# Patient Record
Sex: Female | Born: 1995 | Race: Black or African American | Hispanic: No | Marital: Single | Smoking: Never smoker
Health system: Southern US, Community
[De-identification: ages and names within clinical notes are randomized; demographics above are authoritative.]

## PROBLEM LIST (undated history)

## (undated) DIAGNOSIS — Z8781 Personal history of (healed) traumatic fracture: Secondary | ICD-10-CM

## (undated) HISTORY — DX: Personal history of (healed) traumatic fracture: Z87.81

---
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
## 2014-04-29 ENCOUNTER — Encounter (HOSPITAL_BASED_OUTPATIENT_CLINIC_OR_DEPARTMENT_OTHER): Payer: Self-pay | Admitting: *Deleted

## 2014-05-23 IMAGING — CR DG CHEST 2V
2 series · 2 of 2 positions shown · non-contrast
Comparison: 10/19/2011

CLINICAL DATA: Chest pain

CHEST - 2 VIEW

[w chest pa]
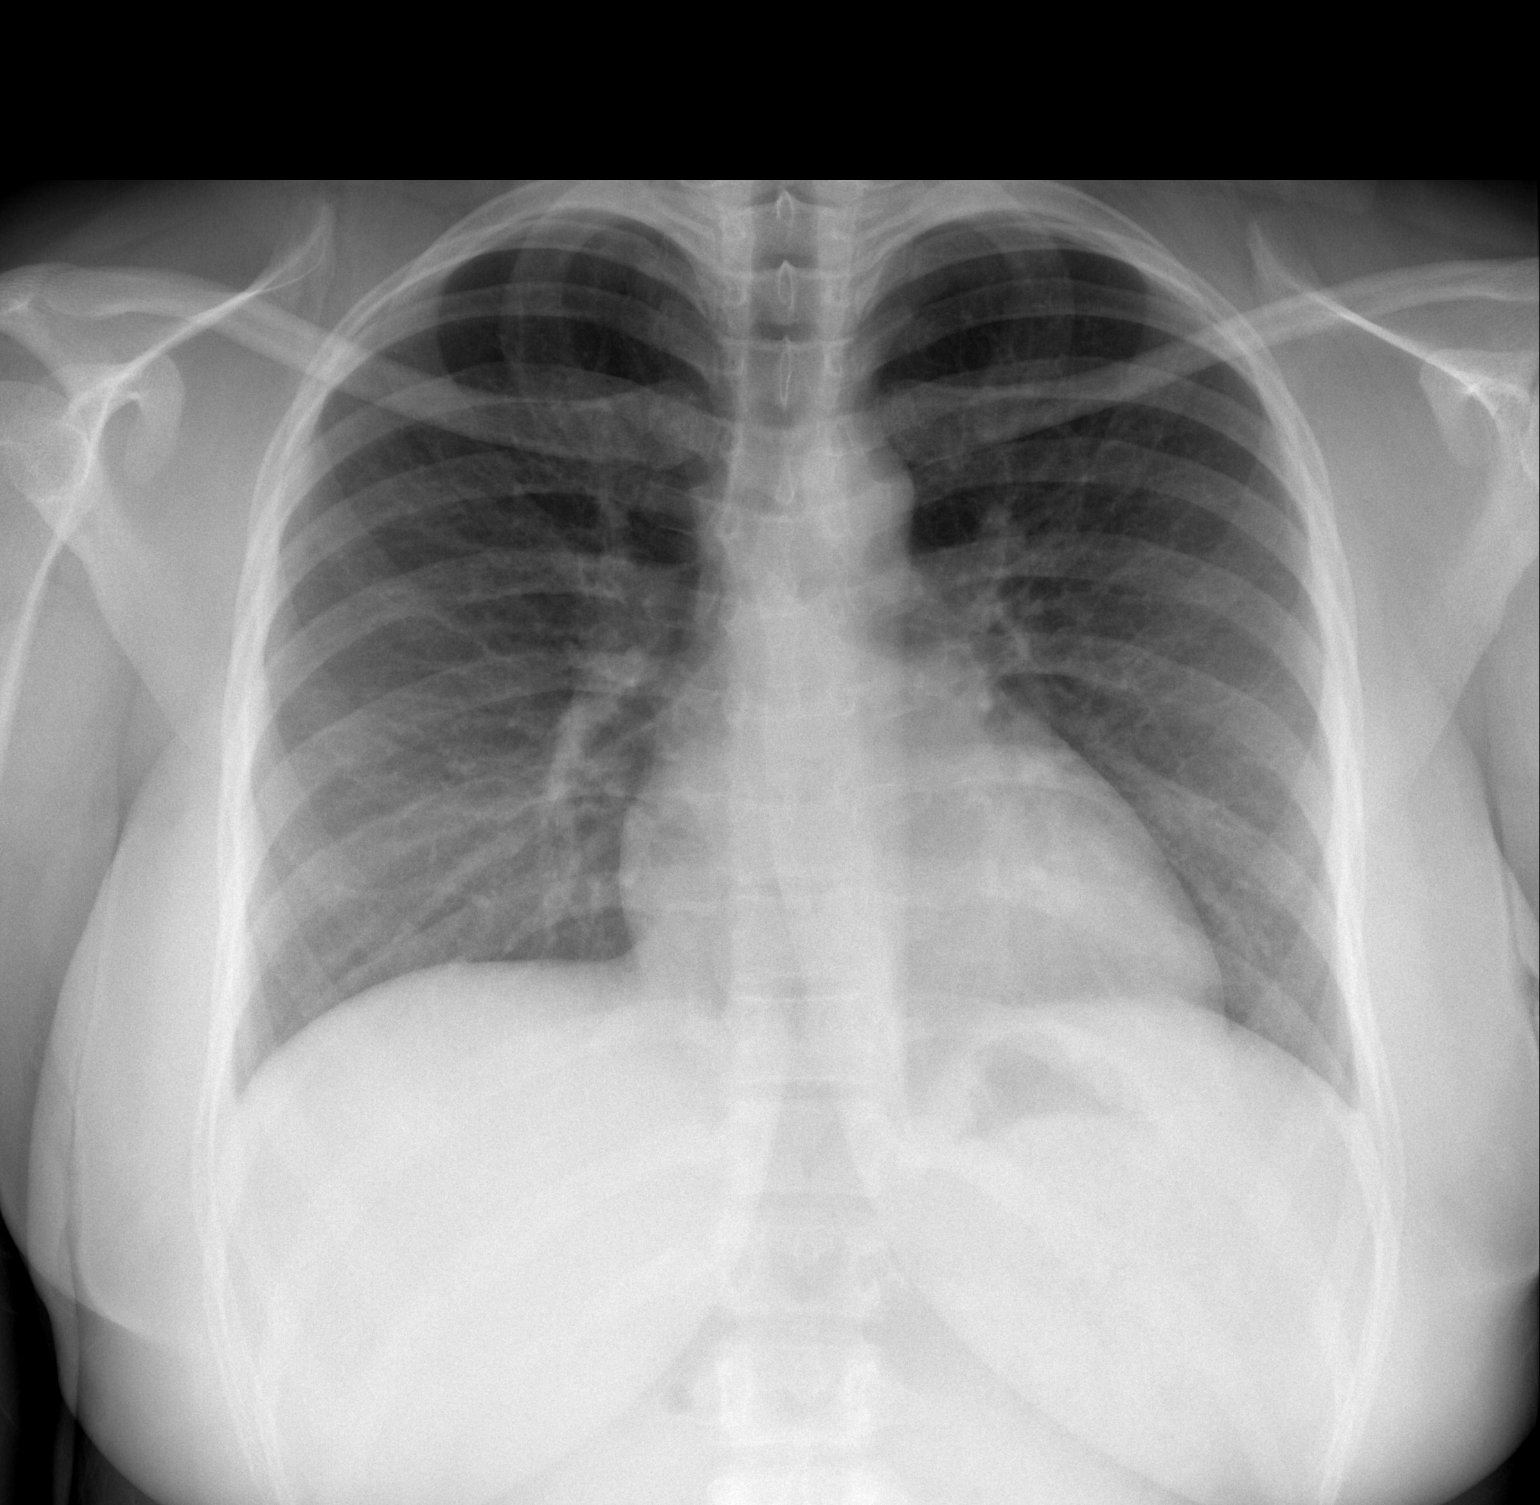

[w chest lat]
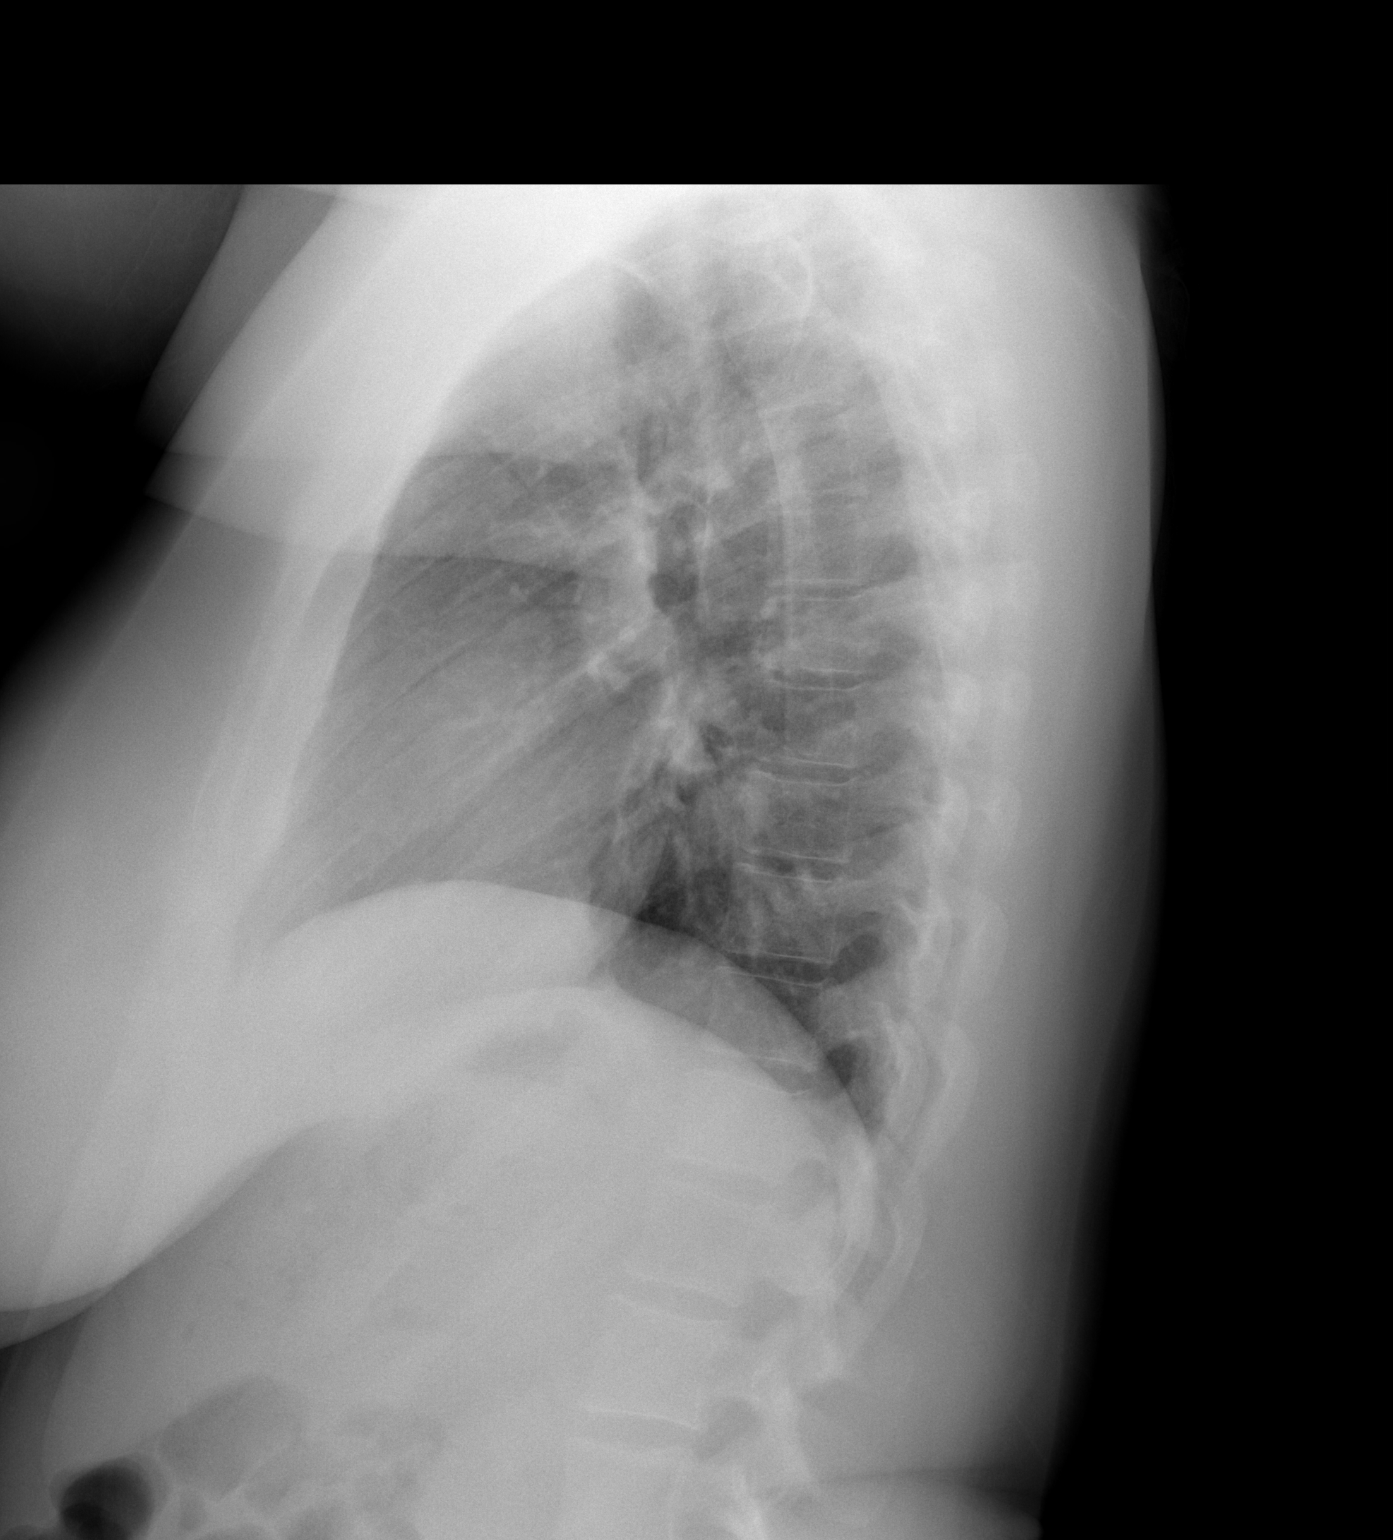

[2 of 2 positions shown; findings below may reference images not displayed]

FINDINGS: The heart and pulmonary vascularity are within normal
limits.  The lungs are clear bilaterally.  No acute bony
abnormality is identified.
IMPRESSION: No acute abnormality noted.

## 2015-01-01 ENCOUNTER — Encounter (HOSPITAL_BASED_OUTPATIENT_CLINIC_OR_DEPARTMENT_OTHER): Payer: Self-pay

## 2015-01-01 ENCOUNTER — Emergency Department (HOSPITAL_BASED_OUTPATIENT_CLINIC_OR_DEPARTMENT_OTHER)
Admission: EM | Admit: 2015-01-01 | Discharge: 2015-01-01 | Disposition: A | Discharge status: Home / Self Care | Payer: Managed Care, Other (non HMO) | Attending: Emergency Medicine | Admitting: Emergency Medicine

## 2015-01-01 DIAGNOSIS — Z8781 Personal history of (healed) traumatic fracture: Secondary | ICD-10-CM | POA: Insufficient documentation

## 2015-01-01 DIAGNOSIS — J020 Streptococcal pharyngitis: Secondary | ICD-10-CM | POA: Diagnosis not present

## 2015-01-01 DIAGNOSIS — J029 Acute pharyngitis, unspecified: Secondary | ICD-10-CM | POA: Diagnosis present

## 2015-01-01 LAB — RAPID STREP SCREEN (MED CTR MEBANE ONLY): STREPTOCOCCUS, GROUP A SCREEN (DIRECT): POSITIVE — AB

## 2015-01-01 MED ORDER — PENICILLIN V POTASSIUM 250 MG/5ML PO SOLR: 500.0000 mg | Freq: Three times a day (TID) | ORAL | Status: AC

## 2015-01-01 MED ORDER — ACETAMINOPHEN 160 MG/5ML PO SOLN
650.0000 mg | Freq: Once | ORAL | Status: AC
  Administered 2015-01-01: 650 mg via ORAL

## 2015-01-01 MED ORDER — ACETAMINOPHEN 325 MG PO TABS: 650.0000 mg | ORAL_TABLET | Freq: Once | ORAL | Status: DC

## 2015-01-01 MED ORDER — AMOXICILLIN 250 MG/5ML PO SUSR
500.0000 mg | Freq: Once | ORAL | Status: AC
  Administered 2015-01-01: 500 mg via ORAL
  Filled 2015-01-01: qty 10

## 2015-01-01 MED ORDER — ACETAMINOPHEN 160 MG/5ML PO SOLN
ORAL | Status: AC
  Filled 2015-01-01: qty 20.3

## 2015-01-01 NOTE — Discharge Instructions (Signed)

## 2015-01-01 NOTE — ED Notes (Signed)
Sore throat, fever, chills-started yesterday [id number]

## 2015-01-01 NOTE — ED Notes (Signed)
MD at bedside. 
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
## 2019-05-08 ENCOUNTER — Emergency Department (HOSPITAL_BASED_OUTPATIENT_CLINIC_OR_DEPARTMENT_OTHER)
Admission: EM | Admit: 2019-05-08 | Discharge: 2019-05-08 | Disposition: A | Discharge status: Home / Self Care | Payer: PRIVATE HEALTH INSURANCE | Attending: Emergency Medicine | Admitting: Emergency Medicine

## 2019-05-08 ENCOUNTER — Other Ambulatory Visit: Payer: Self-pay

## 2019-05-08 ENCOUNTER — Encounter (HOSPITAL_BASED_OUTPATIENT_CLINIC_OR_DEPARTMENT_OTHER): Payer: Self-pay | Admitting: Respiratory Therapy

## 2019-05-08 DIAGNOSIS — U071 COVID-19: Secondary | ICD-10-CM | POA: Diagnosis not present

## 2019-05-08 DIAGNOSIS — Z20828 Contact with and (suspected) exposure to other viral communicable diseases: Secondary | ICD-10-CM

## 2019-05-08 DIAGNOSIS — R05 Cough: Secondary | ICD-10-CM | POA: Diagnosis present

## 2019-05-08 DIAGNOSIS — Z20822 Contact with and (suspected) exposure to covid-19: Secondary | ICD-10-CM

## 2019-05-08 LAB — SARS CORONAVIRUS 2 (TAT 6-24 HRS): SARS Coronavirus 2: POSITIVE — AB

## 2019-05-08 NOTE — ED Triage Notes (Signed)
Cough, loss of taste and smell x 2 days. Known COVID exposure.

## 2019-05-08 NOTE — ED Notes (Signed)
Pt verbalized understanding of dc instructions.
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
## 2019-05-08 NOTE — Discharge Instructions (Signed)
You were evaluated in the emergency department for possible Covid-like symptoms.  Your vitals and oxygen number were normal.  Please continue to isolate until your Covid test is resulted and your symptoms improved.  Return to the emergency department if any acute worsening that concerns you.

## 2023-04-13 ENCOUNTER — Emergency Department (HOSPITAL_BASED_OUTPATIENT_CLINIC_OR_DEPARTMENT_OTHER)
Admission: EM | Admit: 2023-04-13 | Discharge: 2023-04-13 | Disposition: A | Discharge status: Home / Self Care | Payer: No Typology Code available for payment source | Attending: Emergency Medicine | Admitting: Emergency Medicine

## 2023-04-13 ENCOUNTER — Emergency Department (HOSPITAL_BASED_OUTPATIENT_CLINIC_OR_DEPARTMENT_OTHER): Payer: No Typology Code available for payment source

## 2023-04-13 ENCOUNTER — Other Ambulatory Visit: Payer: Self-pay

## 2023-04-13 ENCOUNTER — Encounter (HOSPITAL_BASED_OUTPATIENT_CLINIC_OR_DEPARTMENT_OTHER): Payer: Self-pay

## 2023-04-13 DIAGNOSIS — Y92410 Unspecified street and highway as the place of occurrence of the external cause: Secondary | ICD-10-CM | POA: Diagnosis not present

## 2023-04-13 DIAGNOSIS — M542 Cervicalgia: Secondary | ICD-10-CM | POA: Diagnosis present

## 2023-04-13 DIAGNOSIS — S161XXA Strain of muscle, fascia and tendon at neck level, initial encounter: Secondary | ICD-10-CM | POA: Insufficient documentation
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
## 2023-04-13 NOTE — Discharge Instructions (Signed)
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
